# Patient Record
Sex: Male | Born: 2000 | Race: White | Hispanic: No | Marital: Single | Smoking: Never smoker
Health system: Southern US, Community
[De-identification: ages and names within clinical notes are randomized; demographics above are authoritative.]

## PROBLEM LIST (undated history)

## (undated) DIAGNOSIS — G259 Extrapyramidal and movement disorder, unspecified: Secondary | ICD-10-CM

## (undated) HISTORY — DX: Extrapyramidal and movement disorder, unspecified: G25.9

---
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
## 2022-05-13 IMAGING — DX DG WRIST COMPLETE 3+V*L*
1 series · 4 of 4 positions shown · non-contrast
Comparison: None.

CLINICAL DATA: Fall, left wrist swelling

EXAM:
LEFT WRIST - COMPLETE 3+ VIEW

[Series 1: wrist · 0.14mm/px · 4 of 4 slices shown]
[im 1/4]
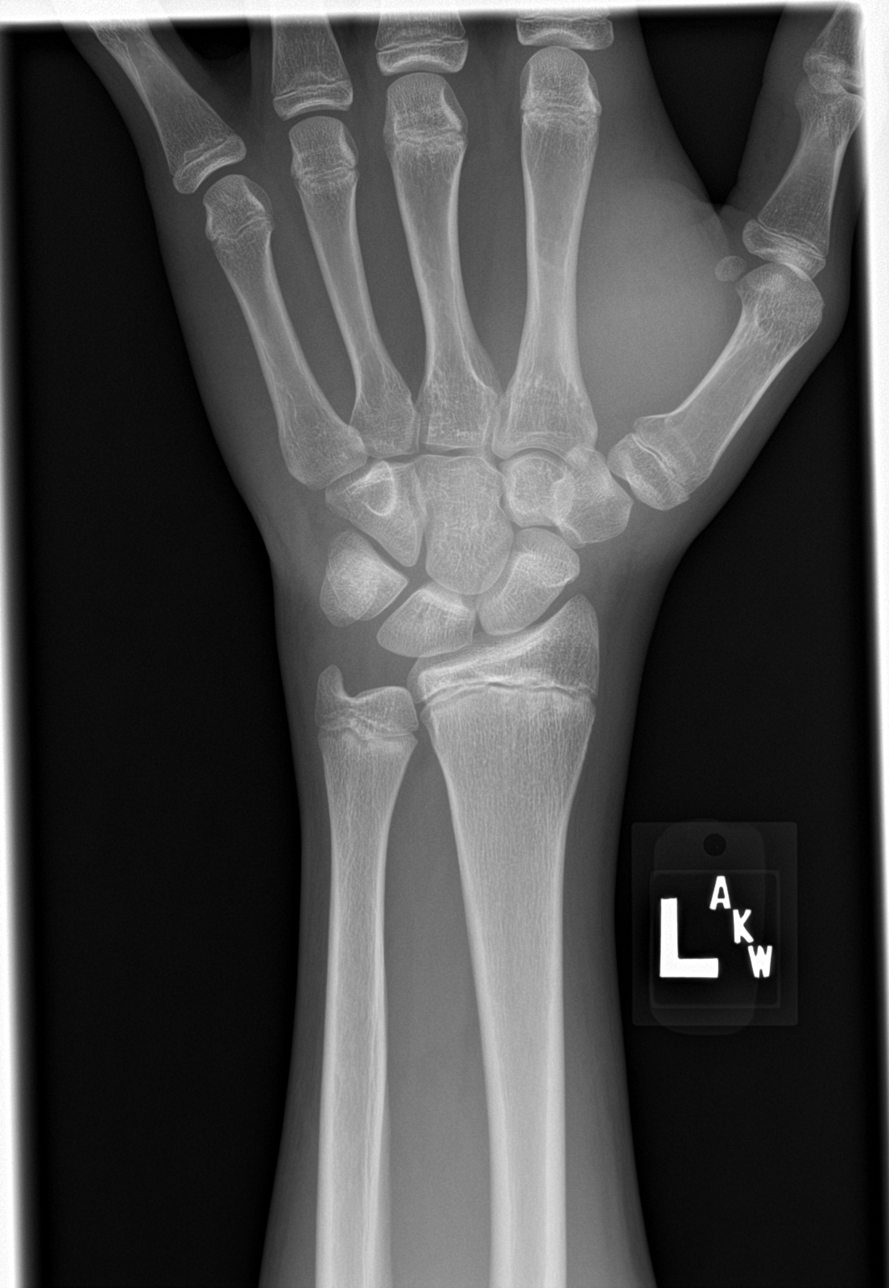
[im 2/4]
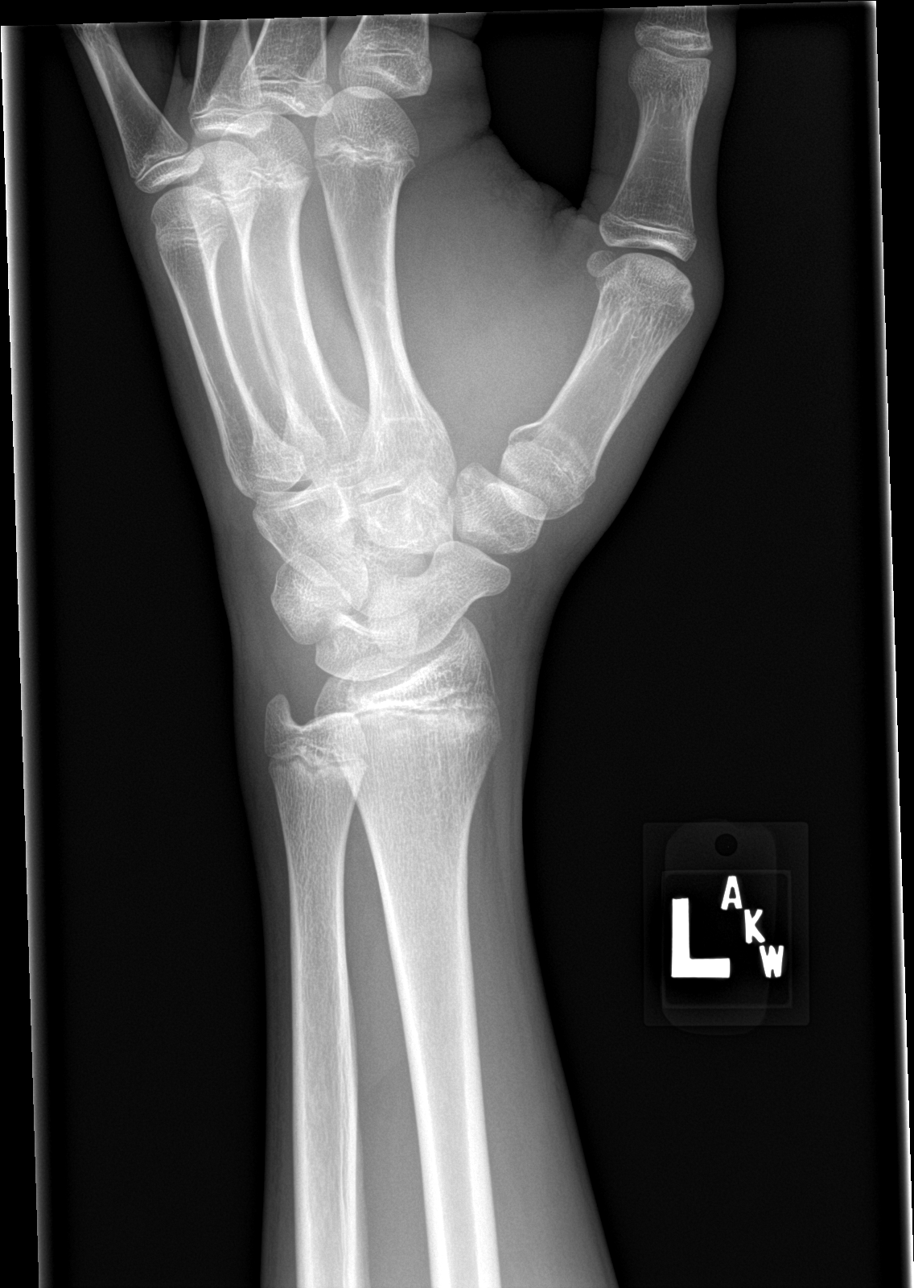
[im 3/4]
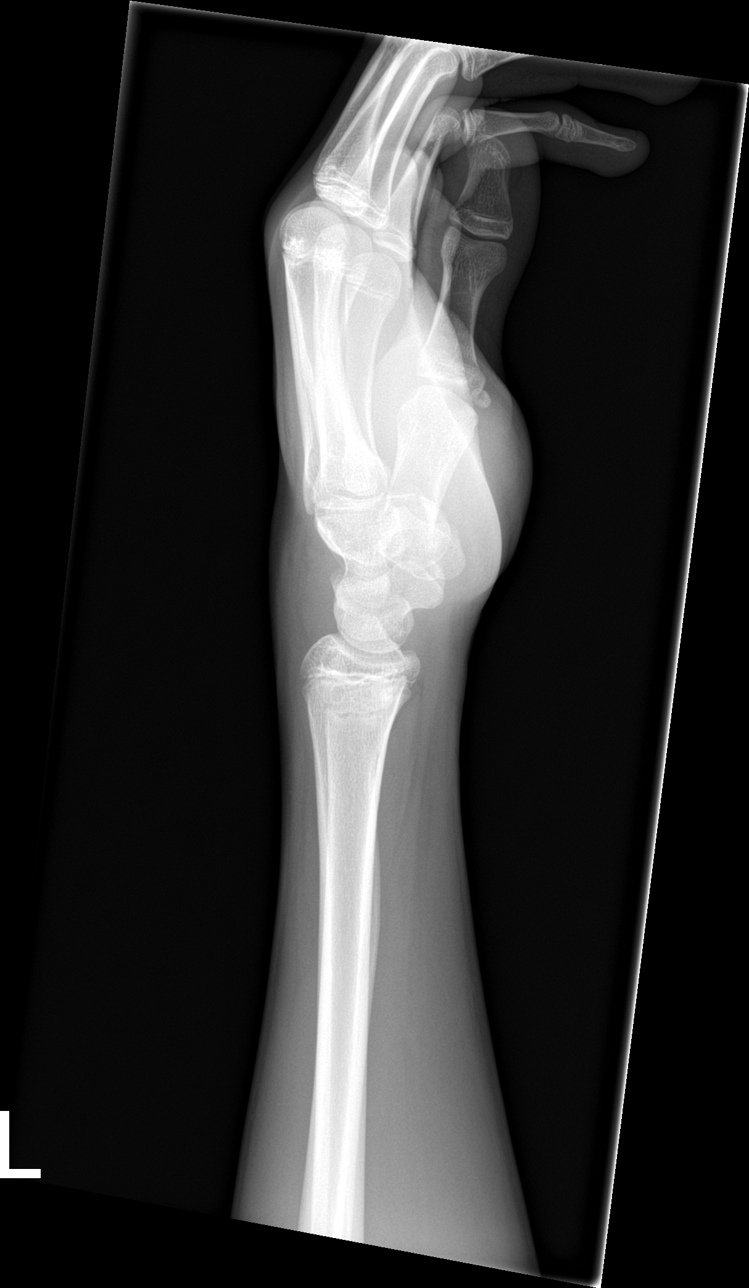
[im 4/4]
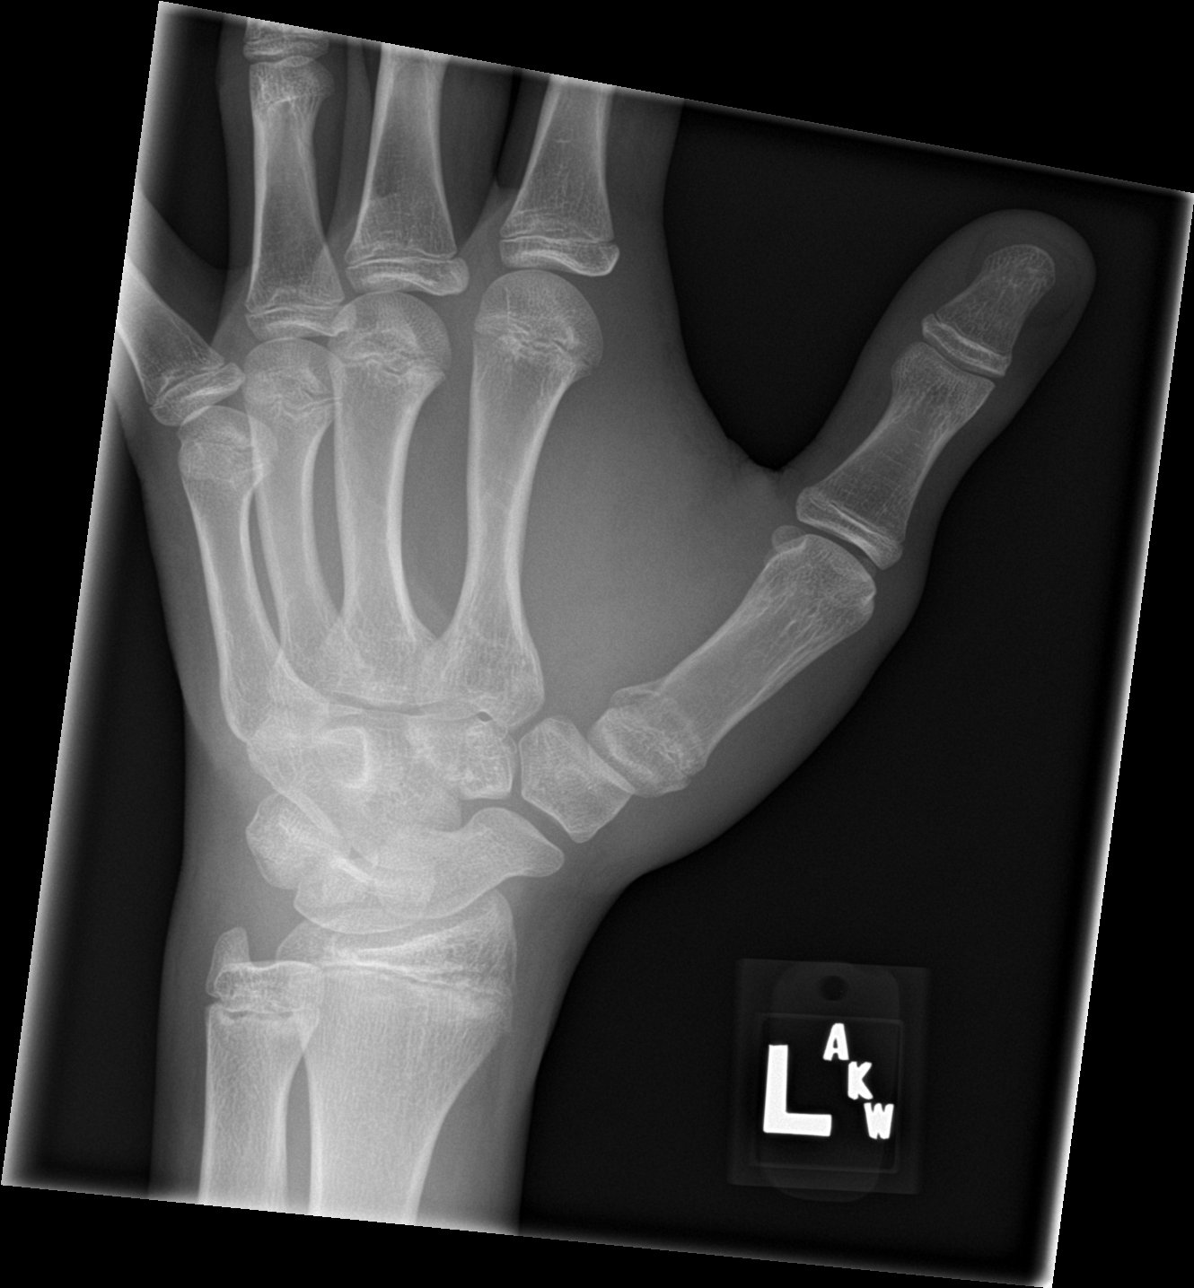

[4 of 4 positions shown; findings below may reference images not displayed]

FINDINGS: Four view radiograph left wrist demonstrates normal alignment. No
fracture or dislocation. Joint spaces are preserved. There is
moderate soft tissue swelling seen dorsal to the carpus. No retained
radiopaque foreign body.
IMPRESSION: Dorsal soft tissue swelling.  No fracture or dislocation.

## 2022-05-13 IMAGING — DX DG HAND COMPLETE 3+V*L*
1 series · 3 of 3 positions shown · non-contrast
Comparison: None.

CLINICAL DATA: Left hand swelling

EXAM:
LEFT HAND - COMPLETE 3+ VIEW

[Series 1: hand · 0.14mm/px · 3 of 3 slices shown]
[im 1/3]
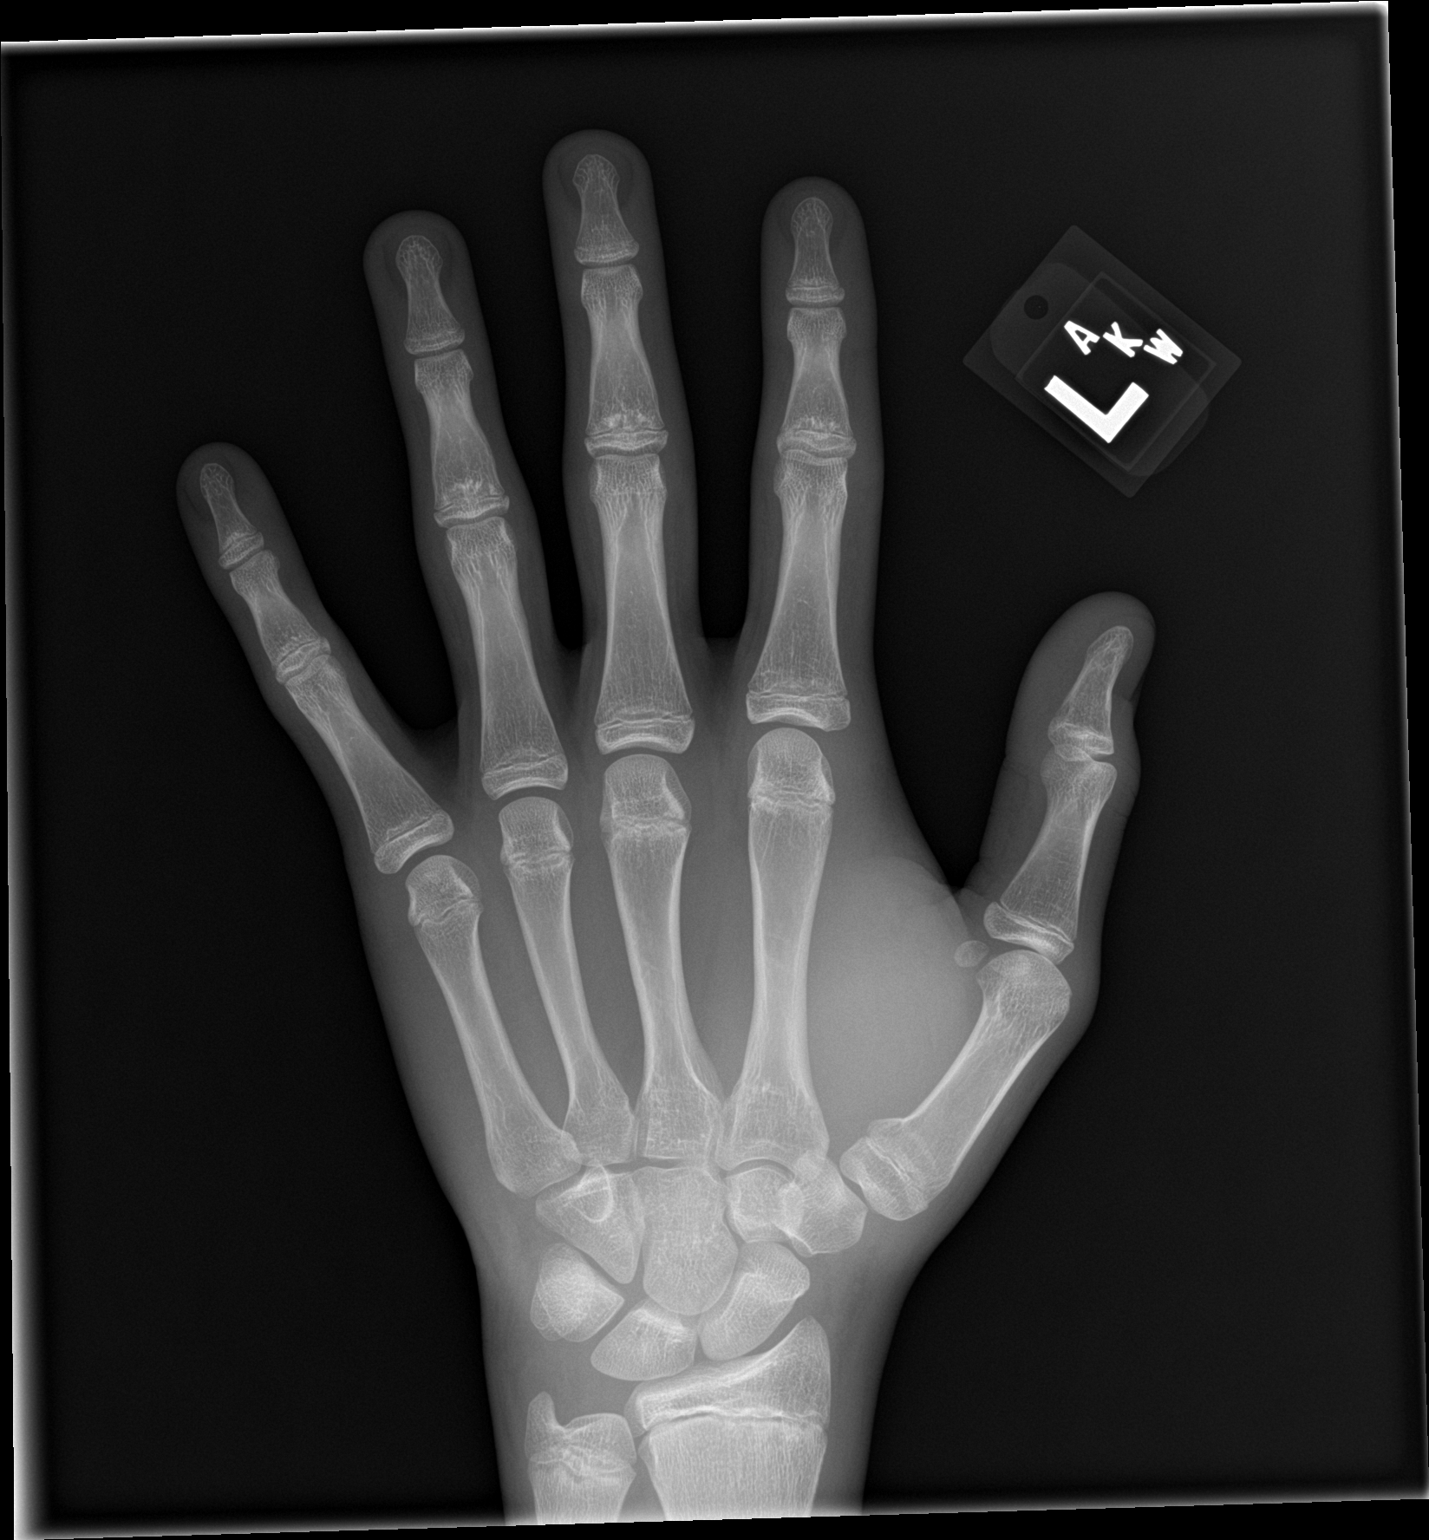
[im 2/3]
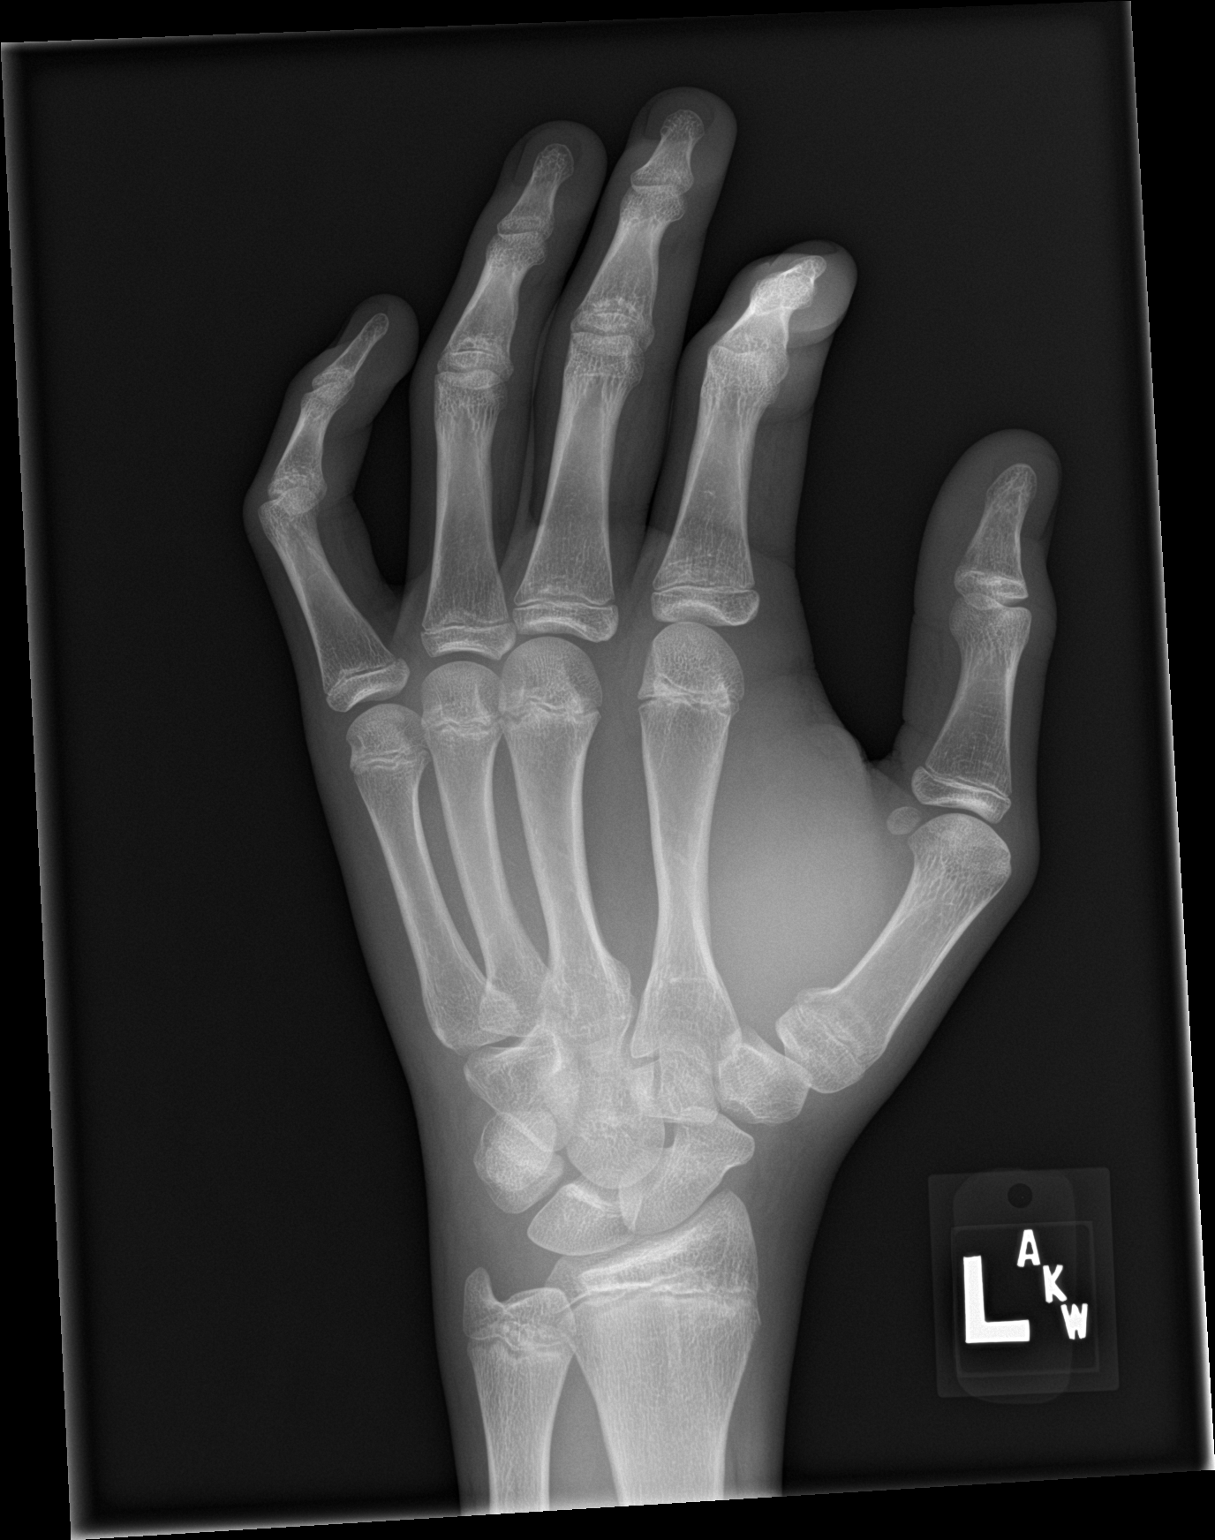
[im 3/3]
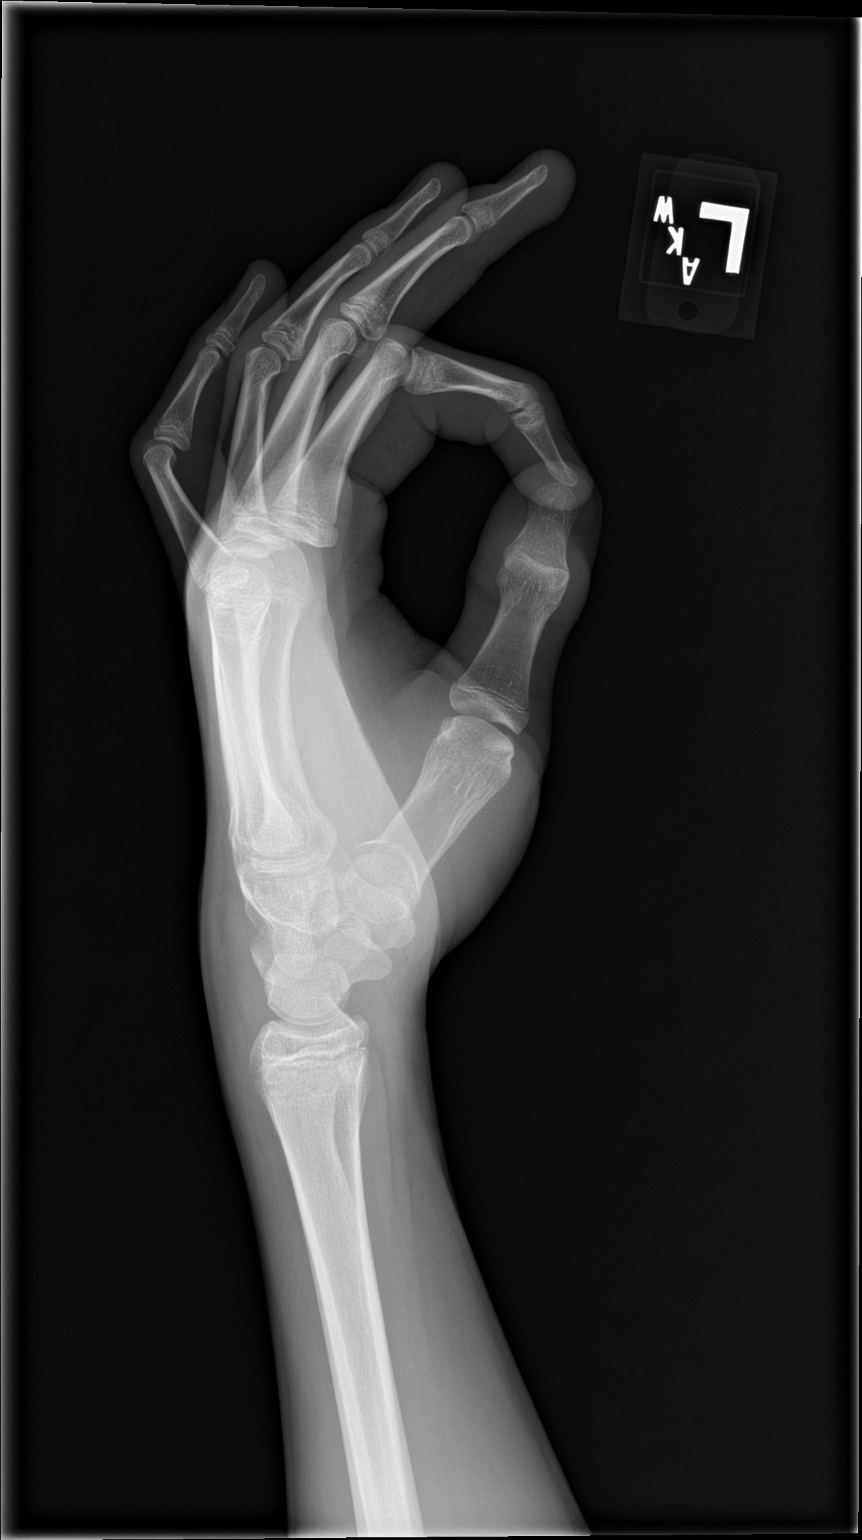

[3 of 3 positions shown; findings below may reference images not displayed]

FINDINGS: There is an acute, transverse fracture of the proximal metaphysis of
the left first metacarpal at the base of the thumb. This does not
appear to involve the physis. Fracture fragments are in near
anatomic alignment. Joint spaces are preserved. No other fracture or
dislocation identified. There is moderate soft tissue swelling
involving the thenar eminence.
IMPRESSION: Acute, extra physeal fracture of the left first metacarpal proximal
diaphysis. Fracture fragments are in near anatomic alignment.

## 2023-02-10 ENCOUNTER — Encounter: Payer: Self-pay | Admitting: Family Medicine

## 2023-02-10 ENCOUNTER — Ambulatory Visit (INDEPENDENT_AMBULATORY_CARE_PROVIDER_SITE_OTHER): Payer: 59 | Admitting: Family Medicine

## 2023-02-10 ENCOUNTER — Ambulatory Visit (INDEPENDENT_AMBULATORY_CARE_PROVIDER_SITE_OTHER): Payer: 59

## 2023-02-10 VITALS — BP 120/74 | HR 67 | Temp 97.4°F | Ht 67.75 in | Wt 146.2 lb

## 2023-02-10 DIAGNOSIS — R053 Chronic cough: Secondary | ICD-10-CM | POA: Diagnosis not present

## 2023-02-10 DIAGNOSIS — R630 Anorexia: Secondary | ICD-10-CM

## 2023-02-10 DIAGNOSIS — R0982 Postnasal drip: Secondary | ICD-10-CM

## 2023-02-10 DIAGNOSIS — R634 Abnormal weight loss: Secondary | ICD-10-CM

## 2023-02-10 DIAGNOSIS — R0981 Nasal congestion: Secondary | ICD-10-CM | POA: Diagnosis not present

## 2023-02-10 DIAGNOSIS — R5383 Other fatigue: Secondary | ICD-10-CM | POA: Diagnosis not present

## 2023-02-10 LAB — COMPREHENSIVE METABOLIC PANEL
ALT: 27 U/L (ref 0–53)
AST: 24 U/L (ref 0–37)
Albumin: 4.9 g/dL (ref 3.5–5.2)
Alkaline Phosphatase: 76 U/L (ref 39–117)
BUN: 11 mg/dL (ref 6–23)
CO2: 29 meq/L (ref 19–32)
Calcium: 9.7 mg/dL (ref 8.4–10.5)
Chloride: 101 meq/L (ref 96–112)
Creatinine, Ser: 0.74 mg/dL (ref 0.40–1.50)
GFR: 130.41 mL/min (ref >60.00)
Glucose, Bld: 92 mg/dL (ref 70–99)
Potassium: 4.1 meq/L (ref 3.5–5.1)
Sodium: 139 meq/L (ref 135–145)
Total Bilirubin: 0.4 mg/dL (ref 0.2–1.2)
Total Protein: 7.4 g/dL (ref 6.0–8.3)

## 2023-02-10 LAB — CBC WITH DIFFERENTIAL/PLATELET
Basophils Absolute: 0 10*3/uL (ref 0.0–0.1)
Basophils Relative: 0.5 % (ref 0.0–3.0)
Eosinophils Absolute: 0.1 10*3/uL (ref 0.0–0.7)
Eosinophils Relative: 1.3 % (ref 0.0–5.0)
HCT: 44 % (ref 39.0–52.0)
Hemoglobin: 14.9 g/dL (ref 13.0–17.0)
Lymphocytes Relative: 42.5 % (ref 12.0–46.0)
Lymphs Abs: 2 10*3/uL (ref 0.7–4.0)
MCHC: 33.9 g/dL (ref 30.0–36.0)
MCV: 90.2 fL (ref 78.0–100.0)
Monocytes Absolute: 0.4 10*3/uL (ref 0.1–1.0)
Monocytes Relative: 8.4 % (ref 3.0–12.0)
Neutro Abs: 2.2 10*3/uL (ref 1.4–7.7)
Neutrophils Relative %: 47.3 % (ref 43.0–77.0)
Platelets: 349 10*3/uL (ref 150.0–400.0)
RBC: 4.87 Mil/uL (ref 4.22–5.81)
RDW: 12.8 % (ref 11.5–14.6)
WBC: 4.7 10*3/uL (ref 4.5–10.5)

## 2023-02-10 LAB — SEDIMENTATION RATE: Sed Rate: 3 mm/h (ref 0–15)

## 2023-02-10 LAB — C-REACTIVE PROTEIN: CRP: <1 mg/dL (ref 0.5–20.0)

## 2023-02-10 NOTE — Progress Notes (Signed)
 New Patient Office Visit  Subjective    Patient ID: Shawn West, male    DOB: Dec 17, 2000  Age: 23 y.o. MRN: [id number]  CC:  Chief Complaint  Patient presents with   Establish Care    Establish Care. Aged out of pediatrician. Patient notes of constant bouts of congestion for the past year. Patient was treated by their allergy  and asthma specialist as well  which helped symptoms for a week but then the symptoms rebounded.    HPI Shawn West presents to establish care today. He is accompanied by his father. Reports chronic nasal congestion for the last year.  Has been treated by allergy  and asthma specialist, Dr. Dasie.  Reports that she is given 2 steroid injections over the last year. He is not currently taking any medications including antihistamines. Reports that he has had allergy  testing twice in the past, allergies to dust, grass have been noted. We are requesting records today. Reports that he is also recently lost weight due to decreased appetite.  States that over the last week or 2 this has mildly improved. Reports that he does occasionally use marijuana, none in the last 2 weeks. Denies abdominal pain, nausea, vomiting, diarrhea, rash, fever, chills, other symptoms. Medical history as outlined below.  They are just  Outpatient Encounter Medications as of 02/10/2023  Medication Sig   clonazePAM  (KLONOPIN ) 0.25 MG disintegrating tablet Take 1 tablet at bedtime for motor tics out of control (Patient not taking: Reported on 02/10/2023)   cloNIDine  (CATAPRES ) 0.1 MG tablet TAKE ONE HALF TABLET BY MOUTH IN THE MORNING AND ONE WHOLE TABLET AT NIGHTTIME (Patient not taking: Reported on 02/10/2023)   No facility-administered encounter medications on file as of 02/10/2023.    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 at the [facility]   EYE SURGERY  2009   [facility]    Family History  Problem Relation Age of Onset   Other Paternal Grandfather        Described as fidgety   Bipolar disorder Maternal Aunt        May have bipolar affective disorder    Social History   Socioeconomic History   Marital status: Single    Spouse name: Not on file   Number of children: Not on file   Years of education: Not on file   Highest education level: Not on file  Occupational History   Not on file  Tobacco Use   Smoking status: Never   Smokeless tobacco: Never  Substance and Sexual Activity   Alcohol use: No    Alcohol/week: 0.0 standard drinks of alcohol   Drug use: No   Sexual activity: Never  Other Topics Concern   Not on file  Social History Narrative   Shawn West is a 10th grade student.   He attends [employer].    He lives with his parents, his brother Aiden, and his three dogs [place], [place], and [place]).    He enjoys volleyball, soccer, flag football, basketball, and cross country.    Social Drivers of Corporate Investment Banker Strain: Not on file  Food Insecurity: Not on file  Transportation Needs: Not on file  Physical Activity: Not on file  Stress: Not on file  Social Connections:  Not on file  Intimate Partner Violence: Not on file    ROS Per HPI      Objective    BP 120/74   Pulse 67   Temp (!) 97.4 F (36.3 C)   Ht 5' 7.75 (1.721 m)   Wt 146 lb 3.2 oz (66.3 kg)   SpO2 99%   BMI 22.39 kg/m   Physical Exam Vitals and nursing note reviewed.  Constitutional:      General: He is not in acute distress.    Appearance: Normal appearance.  HENT:     Head: Normocephalic and atraumatic.     Right Ear: Tympanic membrane and ear canal normal.     Left Ear: Tympanic membrane and ear canal normal.     Nose: Nose normal.     Mouth/Throat:     Mouth: Mucous membranes are moist.     Pharynx: Oropharynx is clear.      Comments: Oropharyngeal cobblestoning   Eyes:     Extraocular Movements: Extraocular movements intact.  Cardiovascular:     Rate and Rhythm: Normal rate and regular rhythm.     Pulses: Normal pulses.     Heart sounds: Normal heart sounds.  Pulmonary:     Effort: Pulmonary effort is normal. No respiratory distress.     Breath sounds: Normal breath sounds.     Comments: Dry cough  Musculoskeletal:        General: Normal range of motion.     Cervical back: Normal range of motion.  Lymphadenopathy:     Cervical: No cervical adenopathy.  Skin:    General: Skin is warm and dry.  Neurological:     General: No focal deficit present.     Mental Status: He is alert and oriented to person, place, and time.  Psychiatric:        Mood and Affect: Mood normal.        Behavior: Behavior normal.         Assessment & Plan:   Chronic nasal congestion -     CBC with Differential/Platelet -     Comprehensive metabolic panel -     Sedimentation rate -     C-reactive protein -     Food Allergy  Profile  Chronic cough -     DG Chest 2 View; Future  Post-nasal drip -     CBC with Differential/Platelet -     Comprehensive metabolic panel -     Sedimentation rate -     C-reactive protein -     Food Allergy  Profile  Other fatigue -     CBC with Differential/Platelet -     Comprehensive metabolic panel -     Sedimentation rate -     C-reactive protein -     Food Allergy  Profile -     DG Chest 2 View; Future  Weight loss -     DG Chest 2 View; Future  Decreased appetite -     DG Chest 2 View; Future     Return if symptoms worsen or fail to improve.   Corean Ku, FNP

## 2023-02-10 NOTE — Progress Notes (Deleted)
   Acute Office Visit  Subjective:     Patient ID: Shawn West, male    DOB: [birth year], 22 y.o.   MRN: [id number]  No chief complaint on file.   HPI Patient is in today for ***  ROS Per HPI      Objective:    There were no vitals taken for this visit.   Physical Exam Vitals and nursing note reviewed.  Constitutional:      Appearance: Normal appearance.  HENT:     Head: Normocephalic and atraumatic.  Eyes:     Extraocular Movements: Extraocular movements intact.  Cardiovascular:     Rate and Rhythm: Normal rate and regular rhythm.     Pulses: Normal pulses.     Heart sounds: Normal heart sounds.  Pulmonary:     Effort: Pulmonary effort is normal.     Breath sounds: Normal breath sounds.  Musculoskeletal:        General: Normal range of motion.     Cervical back: Normal range of motion.  Skin:    General: Skin is warm and dry.  Neurological:     General: No focal deficit present.     Mental Status: He is alert and oriented to person, place, and time.  Psychiatric:        Mood and Affect: Mood normal.        Behavior: Behavior normal.   No results found for any visits on 02/10/23.      Assessment & Plan:  ***  No orders of the defined types were placed in this encounter.   No follow-ups on file.  Corean Ku, FNP

## 2023-02-10 NOTE — Patient Instructions (Signed)
 Welcome to [facility]!  We are getting an xray today. We will be in contact with any abnormal results that require further attention.  We are checking labs today, will be in contact with any results that require further attention  Follow-up with me for new or worsening symptoms.

## 2023-02-13 ENCOUNTER — Other Ambulatory Visit: Payer: Self-pay | Admitting: Family Medicine

## 2023-02-13 ENCOUNTER — Telehealth: Payer: Self-pay

## 2023-02-13 DIAGNOSIS — R630 Anorexia: Secondary | ICD-10-CM

## 2023-02-13 DIAGNOSIS — R053 Chronic cough: Secondary | ICD-10-CM

## 2023-02-13 LAB — FOOD ALLERGY PROFILE
Allergen, Salmon, f41: <0.1 kU/L
Almonds: <0.1 kU/L
CLASS: 0
CLASS: 0
CLASS: 0
CLASS: 0
CLASS: 0
CLASS: 0
CLASS: 0
CLASS: 0
CLASS: 0
CLASS: 0
CLASS: 0
Cashew IgE: <0.1 kU/L
Class: 0
Class: 0
Class: 0
Class: 0
Egg White IgE: <0.1 kU/L
Fish Cod: <0.1 kU/L
Hazelnut: <0.1 kU/L
Milk IgE: <0.1 kU/L
Peanut IgE: <0.1 kU/L
Scallop IgE: <0.1 kU/L
Sesame Seed f10: <0.1 kU/L
Shrimp IgE: <0.1 kU/L
Soybean IgE: <0.1 kU/L
Tuna IgE: <0.1 kU/L
Walnut: <0.1 kU/L
Wheat IgE: <0.1 kU/L

## 2023-02-13 LAB — INTERPRETATION:

## 2023-02-13 NOTE — Telephone Encounter (Signed)
 Called and spoke with patient relaying their lab results. Patient expressed understanding.

## 2023-02-13 NOTE — Progress Notes (Signed)
 Xray is normal, no pneumonia or bronchitis changes noted

## 2023-02-13 NOTE — Telephone Encounter (Signed)
 Copied from CRM [id number]. Topic: Clinical - Lab/Test Results >> Feb 13, 2023 12:40 PM Gurney Maxin H wrote: Reason for CRM: Patient is returning call to clinic regarding lab results on voicemail.  Margues [phone]
